# Patient Record
Sex: Female | Born: 1975 | Race: Black or African American | Hispanic: No | Marital: Married
Health system: Southern US, Community
[De-identification: ages and names within clinical notes are randomized; demographics above are authoritative.]

---

## 2011-07-12 ENCOUNTER — Other Ambulatory Visit (HOSPITAL_COMMUNITY): Payer: Self-pay | Admitting: Family Medicine

## 2011-07-12 ENCOUNTER — Ambulatory Visit (HOSPITAL_COMMUNITY)
Admission: RE | Admit: 2011-07-12 | Discharge: 2011-07-12 | Disposition: A | Discharge status: Home / Self Care | Source: Ambulatory Visit | Attending: Family Medicine | Admitting: Family Medicine

## 2011-07-12 DIAGNOSIS — W19XXXA Unspecified fall, initial encounter: Secondary | ICD-10-CM

## 2011-07-12 DIAGNOSIS — M533 Sacrococcygeal disorders, not elsewhere classified: Secondary | ICD-10-CM | POA: Insufficient documentation

## 2015-01-09 ENCOUNTER — Emergency Department (HOSPITAL_COMMUNITY)
Admission: EM | Admit: 2015-01-09 | Discharge: 2015-01-09 | Disposition: A | Discharge status: Home / Self Care | Attending: Emergency Medicine | Admitting: Emergency Medicine

## 2015-01-09 ENCOUNTER — Emergency Department (HOSPITAL_COMMUNITY)

## 2015-01-09 ENCOUNTER — Encounter (HOSPITAL_COMMUNITY): Payer: Self-pay | Admitting: Emergency Medicine

## 2015-01-09 DIAGNOSIS — Y998 Other external cause status: Secondary | ICD-10-CM | POA: Insufficient documentation

## 2015-01-09 DIAGNOSIS — S59911A Unspecified injury of right forearm, initial encounter: Secondary | ICD-10-CM | POA: Diagnosis present

## 2015-01-09 DIAGNOSIS — Y92410 Unspecified street and highway as the place of occurrence of the external cause: Secondary | ICD-10-CM | POA: Insufficient documentation

## 2015-01-09 DIAGNOSIS — Z3202 Encounter for pregnancy test, result negative: Secondary | ICD-10-CM | POA: Insufficient documentation

## 2015-01-09 DIAGNOSIS — Y9389 Activity, other specified: Secondary | ICD-10-CM | POA: Diagnosis not present

## 2015-01-09 DIAGNOSIS — E669 Obesity, unspecified: Secondary | ICD-10-CM | POA: Insufficient documentation

## 2015-01-09 DIAGNOSIS — S50811A Abrasion of right forearm, initial encounter: Secondary | ICD-10-CM | POA: Insufficient documentation

## 2015-01-09 LAB — I-STAT CREATININE, ED: Creatinine, Ser: 0.9 mg/dL (ref 0.44–1.00)

## 2015-01-09 LAB — HCG, QUANTITATIVE, PREGNANCY: hCG, Beta Chain, Quant, S: 1 m[IU]/mL (ref <5)

## 2015-01-09 MED ORDER — IBUPROFEN 800 MG PO TABS: 800.0000 mg | ORAL_TABLET | Freq: Three times a day (TID) | ORAL | Status: AC

## 2015-01-09 MED ORDER — METHOCARBAMOL 500 MG PO TABS: 500.0000 mg | ORAL_TABLET | Freq: Two times a day (BID) | ORAL | Status: AC

## 2015-01-09 MED ORDER — IOHEXOL 300 MG/ML  SOLN
100.0000 mL | Freq: Once | INTRAMUSCULAR | Status: AC | PRN
  Administered 2015-01-09: 100 mL via INTRAVENOUS

## 2015-01-09 MED ORDER — IBUPROFEN 800 MG PO TABS
800.0000 mg | ORAL_TABLET | Freq: Once | ORAL | Status: AC
  Administered 2015-01-09: 800 mg via ORAL
  Filled 2015-01-09: qty 1
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
## 2015-01-09 NOTE — ED Notes (Signed)
Declined W/C at D/C and was escorted to lobby by RN. 

## 2015-01-09 NOTE — ED Notes (Signed)
Chaplain reports patient has not had XR yet - stopped to see husband and [facility].
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
## 2015-01-09 NOTE — ED Notes (Signed)
Patient transported to X-ray 

## 2015-01-09 NOTE — Discharge Instructions (Signed)
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
